# Patient Record
Sex: Male | Born: 1990 | Race: White | Hispanic: No | Marital: Single | Smoking: Never smoker
Health system: Southern US, Community
[De-identification: ages and names within clinical notes are randomized; demographics above are authoritative.]

## PROBLEM LIST (undated history)

## (undated) DIAGNOSIS — F319 Bipolar disorder, unspecified: Secondary | ICD-10-CM

## (undated) DIAGNOSIS — R61 Generalized hyperhidrosis: Secondary | ICD-10-CM

## (undated) HISTORY — PX: WISDOM TOOTH EXTRACTION: SHX21

---

## 2017-09-01 ENCOUNTER — Emergency Department (HOSPITAL_COMMUNITY)
Admission: EM | Admit: 2017-09-01 | Discharge: 2017-09-01 | Disposition: A | Discharge status: Home / Self Care | Payer: Self-pay | Attending: Physician Assistant | Admitting: Physician Assistant

## 2017-09-01 ENCOUNTER — Other Ambulatory Visit: Payer: Self-pay

## 2017-09-01 ENCOUNTER — Encounter (HOSPITAL_COMMUNITY): Payer: Self-pay | Admitting: *Deleted

## 2017-09-01 DIAGNOSIS — W260XXA Contact with knife, initial encounter: Secondary | ICD-10-CM | POA: Insufficient documentation

## 2017-09-01 DIAGNOSIS — Y990 Civilian activity done for income or pay: Secondary | ICD-10-CM | POA: Insufficient documentation

## 2017-09-01 DIAGNOSIS — Z23 Encounter for immunization: Secondary | ICD-10-CM | POA: Insufficient documentation

## 2017-09-01 DIAGNOSIS — S61311A Laceration without foreign body of left index finger with damage to nail, initial encounter: Secondary | ICD-10-CM

## 2017-09-01 DIAGNOSIS — Y93G1 Activity, food preparation and clean up: Secondary | ICD-10-CM | POA: Insufficient documentation

## 2017-09-01 DIAGNOSIS — Z79899 Other long term (current) drug therapy: Secondary | ICD-10-CM | POA: Insufficient documentation

## 2017-09-01 DIAGNOSIS — S61211A Laceration without foreign body of left index finger without damage to nail, initial encounter: Secondary | ICD-10-CM | POA: Insufficient documentation

## 2017-09-01 DIAGNOSIS — Y929 Unspecified place or not applicable: Secondary | ICD-10-CM | POA: Insufficient documentation

## 2017-09-01 HISTORY — DX: Bipolar disorder, unspecified: F31.9

## 2017-09-01 HISTORY — DX: Generalized hyperhidrosis: R61

## 2017-09-01 MED ORDER — ACETAMINOPHEN 325 MG PO TABS
650.0000 mg | ORAL_TABLET | Freq: Once | ORAL | Status: AC
  Administered 2017-09-01: 650 mg via ORAL
  Filled 2017-09-01: qty 2

## 2017-09-01 MED ORDER — TETANUS-DIPHTH-ACELL PERTUSSIS 5-2.5-18.5 LF-MCG/0.5 IM SUSP
0.5000 mL | Freq: Once | INTRAMUSCULAR | Status: AC
  Administered 2017-09-01: 0.5 mL via INTRAMUSCULAR
  Filled 2017-09-01: qty 0.5

## 2017-09-01 MED ORDER — LIDOCAINE-EPINEPHRINE-TETRACAINE (LET) SOLUTION: 3.0000 mL | Freq: Once | NASAL | Status: DC

## 2017-09-01 NOTE — Discharge Instructions (Addendum)
We have applied "quick clot" to your wound to stop the bleeding. Leave the dressing on for 24 hours. Do not remove the artificial scab that the quick clot forms. Let it fall off itself. Keep the wound covered while at work or doing any activity that it may get dirty. Follow up with your doctor or return here for any problems.

## 2017-09-01 NOTE — ED Triage Notes (Signed)
Pt was at work and cutting lettuce when pt sliced left index finger.  Dressing on finger and bleeding noted on bandage.  Pt reports throbbing pain. Pt unsure tetanus shot status.  Pt reports cleaning wound prior to arrival.  Pt a/o 4 and ambulatory.

## 2017-09-01 NOTE — ED Provider Notes (Signed)
[facility]-EMERGENCY DEPT Provider Note   CSN: [id number] Arrival date & time: 09/01/17  1436     History   Chief Complaint Chief Complaint  Patient presents with  . Finger Injury    left index finger laceration    HPI Paul Cooke is a 27 y.o. male who presents to the ED with a laceration to the left index finger that occurred while she was cutting lettuce at work just prior to coming to the ED. Patient unsure of last tetanus.   HPI  Past Medical History:  Diagnosis Date  . Bipolar 1 disorder (HCC)   . Hyperhidrosis     There are no active problems to display for this patient.   Past Surgical History:  Procedure Laterality Date  . WISDOM TOOTH EXTRACTION          Home Medications    Prior to Admission medications   Medication Sig Start Date End Date Taking? Authorizing Provider  Brexpiprazole (REXULTI) 0.5 MG TABS Take 0.5 mg by mouth daily.   Yes [provider]  hydrOXYzine (ATARAX/VISTARIL) 25 MG tablet Take 25 mg by mouth as needed for anxiety.   Yes [provider]  Multiple Vitamin (MULTIVITAMIN WITH MINERALS) TABS tablet Take 1 tablet by mouth daily.   Yes [provider]  oxymetazoline (AFRIN) 0.05 % nasal spray Place 1 spray into both nostrils daily as needed for congestion.   Yes [provider]  VITAMIN D, ERGOCALCIFEROL, PO Take 5,000 Units by mouth daily.   Yes [provider]    Family History No family history on file.  Social History Social History   Tobacco Use  . Smoking status: Never Smoker  . Smokeless tobacco: Never Used  Substance Use Topics  . Alcohol use: Yes    Comment: occasional  . Drug use: Never     Allergies   Patient has no known allergies.   Review of Systems Review of Systems  Skin: Positive for wound.  All other systems reviewed and are negative.    Physical Exam Updated Vital Signs BP 140/89 (BP Location: Right Arm)   Pulse 78   Temp  98.1 F (36.7 C) (Oral)   Resp 16   SpO2 100%   Physical Exam  Constitutional: He appears well-developed and well-nourished. No distress.  HENT:  Head: Normocephalic and atraumatic.  Eyes: EOM are normal.  Neck: Neck supple.  Cardiovascular: Normal rate.  Pulmonary/Chest: Effort normal.  Musculoskeletal: Normal range of motion.       Left hand: He exhibits laceration.       Hands: Left index finger with avulsion wound at the tip and partial avulsion of the nail.   Neurological: He is alert.  Skin: Skin is warm and dry.  Psychiatric: He has a normal mood and affect.  Nursing note and vitals reviewed.    ED Treatments / Results  Labs (all labs ordered are listed, but only abnormal results are displayed) Labs Reviewed - No data to display  EKG None  Radiology No results found.  Procedures .Marland KitchenLaceration Repair Date/Time: 09/01/2017 4:30 PM Performed by: Janne Napoleon, NP Authorized by: Janne Napoleon, NP   Consent:    Consent obtained:  Verbal   Consent given by:  Patient   Risks discussed:  Poor cosmetic result   Alternatives discussed:  No treatment Anesthesia (see MAR for exact dosages):    Anesthesia method:  None Exploration:    Hemostasis achieved with:  Direct pressure  Wound exploration: entire depth of wound probed and visualized     Contaminated: no   Post-procedure details:    Patient tolerance of procedure:  Tolerated well, no immediate complications Comments:     A finger tourniquet was applied to stop the bleeding from the avulsion wound. Quick clot powder applied to the wound. Tourniquet removed and dressing applied.    (including critical care time)  Medications Ordered in ED Medications  lidocaine-EPINEPHrine-tetracaine (LET) solution (3 mLs Topical Not Given 09/01/17 1616)  Tdap (BOOSTRIX) injection 0.5 mL (has no administration in time range)  acetaminophen (TYLENOL) tablet 650 mg (has no administration in time range)     Initial  Impression / Assessment and Plan / ED Course  I have reviewed the triage vital signs and the nursing notes. 27 y.o. male with avulsion wound to the tip of the left index finger stable for  D/c after bleeding stopped with quick clot. Patient to f/u with PCP or return here for any problems.   Final Clinical Impressions(s) / ED Diagnoses   Final diagnoses:  Laceration of left index finger without foreign body with damage to nail, initial encounter    ED Discharge Orders    None       Kerrie [place], [place] 09/01/17 1638    Abelino Derrick, MD 09/04/17 1504

## 2017-09-06 ENCOUNTER — Encounter (HOSPITAL_COMMUNITY): Payer: Self-pay

## 2017-09-06 ENCOUNTER — Other Ambulatory Visit: Payer: Self-pay

## 2017-09-06 ENCOUNTER — Emergency Department (HOSPITAL_COMMUNITY): Payer: Self-pay

## 2017-09-06 ENCOUNTER — Emergency Department (HOSPITAL_COMMUNITY)
Admission: EM | Admit: 2017-09-06 | Discharge: 2017-09-06 | Disposition: A | Discharge status: Home / Self Care | Payer: Self-pay | Attending: Emergency Medicine | Admitting: Emergency Medicine

## 2017-09-06 DIAGNOSIS — Z79899 Other long term (current) drug therapy: Secondary | ICD-10-CM | POA: Insufficient documentation

## 2017-09-06 DIAGNOSIS — S6992XD Unspecified injury of left wrist, hand and finger(s), subsequent encounter: Secondary | ICD-10-CM

## 2017-09-06 DIAGNOSIS — R590 Localized enlarged lymph nodes: Secondary | ICD-10-CM | POA: Insufficient documentation

## 2017-09-06 MED ORDER — CEPHALEXIN 500 MG PO CAPS
500.0000 mg | ORAL_CAPSULE | Freq: Four times a day (QID) | ORAL | 0 refills | Status: AC
Start: 2017-09-06 — End: ?

## 2017-09-06 NOTE — ED Notes (Signed)
Bed: WTR9 Expected date:  Expected time:  Means of arrival:  Comments: 

## 2017-09-06 NOTE — ED Notes (Signed)
Bed: WTR8 Expected date:  Expected time:  Means of arrival:  Comments: 

## 2017-09-06 NOTE — ED Triage Notes (Signed)
Patient reports he got out of the shower and noticed swollen lymph nodes of the left axilla. Patient had a tetanus and had an infection of the left index finger.
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
## 2017-09-06 NOTE — Discharge Instructions (Signed)
Your evaluation today is reassuring, lymphadenopathy is likely due to inflammation, but out of caution we will start you on a course of Keflex, please take 4 times daily as directed.  You may continue using Aleve, Tylenol, ice and elevation for the pain in your finger.  Your x-ray today looks good.

## 2019-10-10 IMAGING — CR DG FINGER INDEX 2+V*L*
3 series · 3 of 3 positions shown · non-contrast
Comparison: None.

CLINICAL DATA: Soft tissue infection after laceration distal
phalanx

EXAM:
LEFT SECOND FINGER 2+V

[x finger pa left]
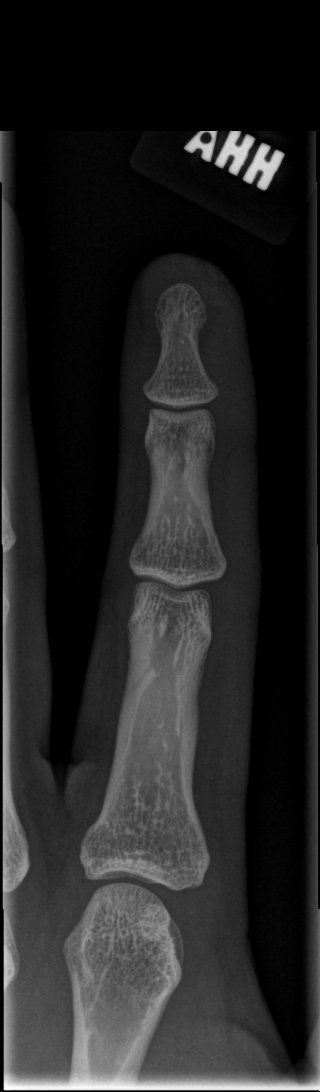

[x finger obl left]
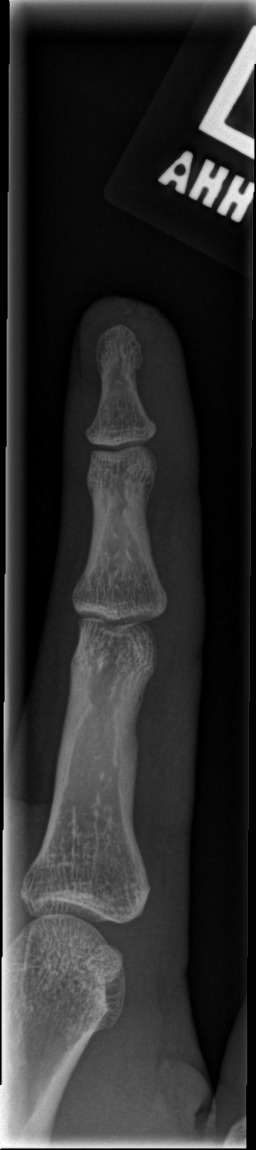

[x finger lat left]
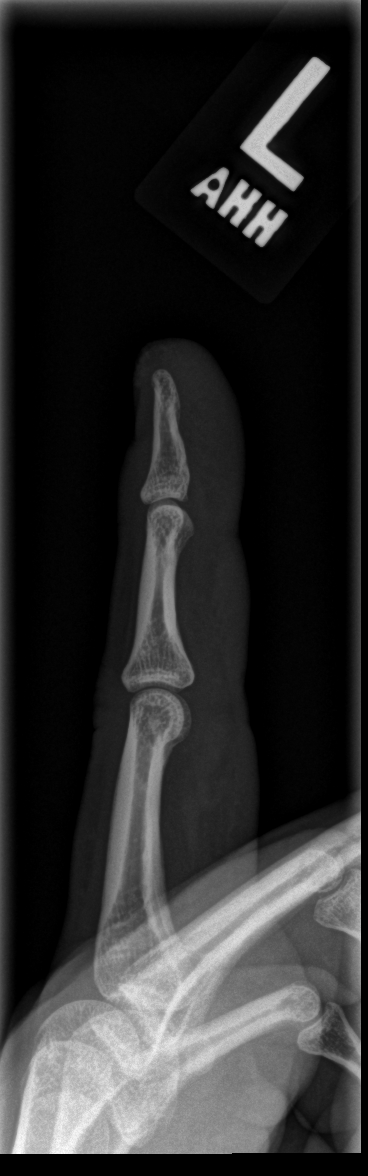

[3 of 3 positions shown; findings below may reference images not displayed]

FINDINGS: Frontal, oblique, and lateral views obtained. No soft tissue abscess
or air is evident by radiography. No fracture or dislocation. Joint
spaces appear normal. No erosive change or bony destruction.
IMPRESSION: No fracture or dislocation. No bony destruction. No arthropathy. No
obvious soft tissue abscess or air evident by radiography.

## 2021-10-05 ENCOUNTER — Emergency Department (HOSPITAL_BASED_OUTPATIENT_CLINIC_OR_DEPARTMENT_OTHER)
Admission: EM | Admit: 2021-10-05 | Discharge: 2021-10-05 | Disposition: A | Discharge status: Home / Self Care | Payer: BC Managed Care – PPO | Attending: Emergency Medicine | Admitting: Emergency Medicine

## 2021-10-05 ENCOUNTER — Encounter (HOSPITAL_BASED_OUTPATIENT_CLINIC_OR_DEPARTMENT_OTHER): Payer: Self-pay | Admitting: Urology

## 2021-10-05 ENCOUNTER — Other Ambulatory Visit: Payer: Self-pay

## 2021-10-05 DIAGNOSIS — S61230A Puncture wound without foreign body of right index finger without damage to nail, initial encounter: Secondary | ICD-10-CM | POA: Diagnosis not present

## 2021-10-05 DIAGNOSIS — Z203 Contact with and (suspected) exposure to rabies: Secondary | ICD-10-CM | POA: Insufficient documentation

## 2021-10-05 DIAGNOSIS — Z2914 Encounter for prophylactic rabies immune globin: Secondary | ICD-10-CM | POA: Insufficient documentation

## 2021-10-05 DIAGNOSIS — Y92810 Car as the place of occurrence of the external cause: Secondary | ICD-10-CM | POA: Insufficient documentation

## 2021-10-05 DIAGNOSIS — W5501XA Bitten by cat, initial encounter: Secondary | ICD-10-CM | POA: Insufficient documentation

## 2021-10-05 DIAGNOSIS — Z23 Encounter for immunization: Secondary | ICD-10-CM | POA: Insufficient documentation

## 2021-10-05 DIAGNOSIS — Y9389 Activity, other specified: Secondary | ICD-10-CM | POA: Insufficient documentation

## 2021-10-05 DIAGNOSIS — S6991XA Unspecified injury of right wrist, hand and finger(s), initial encounter: Secondary | ICD-10-CM | POA: Diagnosis present

## 2021-10-05 MED ORDER — RABIES VACCINE, PCEC IM SUSR
1.0000 mL | Freq: Once | INTRAMUSCULAR | Status: AC
  Administered 2021-10-05: 1 mL via INTRAMUSCULAR
  Filled 2021-10-05: qty 1

## 2021-10-05 MED ORDER — RABIES IMMUNE GLOBULIN 150 UNIT/ML IM INJ
20.0000 [IU]/kg | INJECTION | Freq: Once | INTRAMUSCULAR | Status: AC
  Administered 2021-10-05: 2100 [IU] via INTRAMUSCULAR
  Filled 2021-10-05: qty 14

## 2021-10-05 NOTE — ED Notes (Signed)
Pt tolerated rabies tx injections well.

## 2021-10-05 NOTE — ED Provider Notes (Signed)
[facility] EMERGENCY DEPARTMENT Provider Note   CSN: [id number] Arrival date & time: 10/05/21  1521     History  Chief Complaint  Patient presents with   Animal Bite    Paul Cooke is a 31 y.o. male.   Animal Bite Patient is a 31 year old male with no pertinent past medical history presented emergency room today with complaints of cat bite yesterday.  He states that he was trying to help a woman move kittens back into her car that had gotten out of the car when the door was opened and was putting the kitten and playing with a kitten however when he picked the kitten up it bit him on the right index finger.  He immediately and thoroughly rinsed the cut and went to his PCP this morning who started him on doxycycline.  He came to the ER few hours later with his partner who is concerned that he may have been exposed to rabies.     Home Medications Prior to Admission medications   Medication Sig Start Date End Date Taking? Authorizing Provider  Brexpiprazole (REXULTI) 0.5 MG TABS Take 0.5 mg by mouth daily.    [provider]  cephALEXin (KEFLEX) 500 MG capsule Take 1 capsule (500 mg total) by mouth 4 (four) times daily. 09/06/17   Dartha Lodge, PA-C  hydrOXYzine (ATARAX/VISTARIL) 25 MG tablet Take 25 mg by mouth as needed for anxiety.    [provider]  Multiple Vitamin (MULTIVITAMIN WITH MINERALS) TABS tablet Take 1 tablet by mouth daily.    [provider]  oxymetazoline (AFRIN) 0.05 % nasal spray Place 1 spray into both nostrils daily as needed for congestion.    [provider]  VITAMIN D, ERGOCALCIFEROL, PO Take 5,000 Units by mouth daily.    [provider]      Allergies    Patient has no known allergies.    Review of Systems   Review of Systems  Physical Exam Updated Vital Signs BP 127/77 (BP Location: Left Arm)   Pulse 70   Temp 98.4 F (36.9 C) (Oral)   Resp 19   Ht 6' (1.829 m)   Wt 104.3 kg   SpO2  100%   BMI 31.19 kg/m  Physical Exam Vitals and nursing note reviewed.  Constitutional:      General: He is not in acute distress.    Appearance: Normal appearance. He is not ill-appearing.  HENT:     Head: Normocephalic and atraumatic.  Eyes:     General: No scleral icterus.       Right eye: No discharge.        Left eye: No discharge.     Conjunctiva/sclera: Conjunctivae normal.  Pulmonary:     Effort: Pulmonary effort is normal.     Breath sounds: No stridor.  Skin:    General: Skin is warm and dry.     Comments: Right index finger with puncture wound to the palmar aspect of the distal pad of the finger.  Small puncture to nail of R index finger  Neurological:     Mental Status: He is alert and oriented to person, place, and time. Mental status is at baseline.     ED Results / Procedures / Treatments   Labs (all labs ordered are listed, but only abnormal results are displayed) Labs Reviewed - No data to display  EKG None  Radiology No results found.  Procedures Procedures    Medications Ordered in ED Medications  rabies immune globulin (HYPERAB/KEDRAB) injection 2,100 Units (2,100 Units Intramuscular Given 10/05/21 1746)  rabies vaccine (RABAVERT) injection 1 mL (1 mL Intramuscular Given 10/05/21 1743)    ED Course/ Medical Decision Making/ A&P                           Medical Decision Making  Patient is a 31 year old male with no pertinent past medical history presented emergency room today with complaints of cat bite yesterday.  He states that he was trying to help a woman move kittens back into her car that had gotten out of the car when the door was opened and was putting the kitten and playing with a kitten however when he picked the kitten up it bit him on the right index finger.  He immediately and thoroughly rinsed the cut and went to his PCP this morning who started him on doxycycline.  He came to the ER few hours later with his partner who is  concerned that he may have been exposed to rabies.  When a very lengthy transitioning conversation about whether or not to provide patient with rabies vaccine and immunoglobulin's.  We will go ahead with treatment.  Patient will require injections at the below dates  Clinic that will administer your rabies vaccines:    DAY 0:  10/05/2021      DAY 3:  10/08/2021       DAY 7:  10/12/2021     DAY 14:  10/19/2021         Return precautions discussed.   On doxycycline for wound prophylaxis.   Final Clinical Impression(s) / ED Diagnoses Final diagnoses:  Cat bite, initial encounter    Rx / DC Orders ED Discharge Orders     None         Gailen Shelter, [place] 10/05/21 1804    Sloan Leiter, DO 10/07/21 0119

## 2021-10-05 NOTE — ED Triage Notes (Signed)
Bit by kitten yesterday, stray cat in town  Bit in right index finger, states tooth went through nail  Tdap UTD  Started on doxycycline by pcp this am, instructed to come to ER for rabies injections

## 2021-10-05 NOTE — Discharge Instructions (Addendum)
Please follow-up as scheduled for your rabies injections  I recommend calling the [facility] urgent care to ensure that they have the rabies injection prior to scheduling appointment.  Below I have copied the days that you need to have these injections.  Clinic that will administer your rabies vaccines:    DAY 0:  10/05/2021      DAY 3:  10/08/2021       DAY 7:  10/12/2021     DAY 14:  10/19/2021

## 2021-10-08 ENCOUNTER — Ambulatory Visit (HOSPITAL_COMMUNITY)
Admission: RE | Admit: 2021-10-08 | Discharge: 2021-10-08 | Disposition: A | Discharge status: Home / Self Care | Payer: BC Managed Care – PPO | Source: Ambulatory Visit | Attending: Family Medicine | Admitting: Family Medicine

## 2021-10-08 DIAGNOSIS — Z203 Contact with and (suspected) exposure to rabies: Secondary | ICD-10-CM | POA: Diagnosis not present

## 2021-10-08 MED ORDER — RABIES VACCINE, PCEC IM SUSR
1.0000 mL | Freq: Once | INTRAMUSCULAR | Status: AC
Start: 2021-10-08 — End: 2021-10-08
  Administered 2021-10-08: 1 mL via INTRAMUSCULAR

## 2021-10-08 MED ORDER — RABIES VACCINE, PCEC IM SUSR
INTRAMUSCULAR | Status: AC
  Filled 2021-10-08: qty 1

## 2021-10-08 NOTE — ED Triage Notes (Signed)
Pt presents for second rabies injection  

## 2021-10-12 ENCOUNTER — Ambulatory Visit (HOSPITAL_COMMUNITY): Payer: BC Managed Care – PPO

## 2021-10-12 ENCOUNTER — Ambulatory Visit (HOSPITAL_COMMUNITY): Payer: Self-pay

## 2021-10-12 ENCOUNTER — Ambulatory Visit (HOSPITAL_COMMUNITY)
Admission: RE | Admit: 2021-10-12 | Discharge: 2021-10-12 | Disposition: A | Discharge status: Home / Self Care | Payer: BC Managed Care – PPO | Source: Ambulatory Visit | Attending: Internal Medicine | Admitting: Internal Medicine

## 2021-10-12 DIAGNOSIS — Z203 Contact with and (suspected) exposure to rabies: Secondary | ICD-10-CM

## 2021-10-12 MED ORDER — RABIES VACCINE, PCEC IM SUSR
INTRAMUSCULAR | Status: AC
  Filled 2021-10-12: qty 1

## 2021-10-12 MED ORDER — RABIES VACCINE, PCEC IM SUSR
1.0000 mL | Freq: Once | INTRAMUSCULAR | Status: AC
  Administered 2021-10-12: 1 mL via INTRAMUSCULAR

## 2021-10-12 NOTE — ED Triage Notes (Signed)
Pt here for 3rd rabies injection 

## 2021-10-19 ENCOUNTER — Ambulatory Visit (HOSPITAL_COMMUNITY)
Admission: EM | Admit: 2021-10-19 | Discharge: 2021-10-19 | Disposition: A | Discharge status: Home / Self Care | Payer: BC Managed Care – PPO | Attending: Internal Medicine | Admitting: Internal Medicine

## 2021-10-19 MED ORDER — RABIES VACCINE, PCEC IM SUSR
INTRAMUSCULAR | Status: AC
  Filled 2021-10-19: qty 1

## 2021-10-19 MED ORDER — RABIES VACCINE, PCEC IM SUSR
1.0000 mL | Freq: Once | INTRAMUSCULAR | Status: AC
Start: 2021-10-19 — End: 2021-10-19
  Administered 2021-10-19: 1 mL via INTRAMUSCULAR

## 2021-10-19 NOTE — ED Triage Notes (Signed)
Pt is present for 4th rabies vaccine. Pt denies any pain or discomfort at the site
